# Patient Record
Sex: Male | Born: 1970 | Race: Black or African American | Hispanic: No | Marital: Married | Smoking: Never smoker
Health system: Southern US, Community
[De-identification: ages and names within clinical notes are randomized; demographics above are authoritative.]

---

## 1999-11-11 ENCOUNTER — Emergency Department (HOSPITAL_COMMUNITY): Admission: EM | Admit: 1999-11-11 | Discharge: 1999-11-11 | Payer: Self-pay | Admitting: Emergency Medicine

## 1999-11-26 ENCOUNTER — Encounter: Payer: Self-pay | Admitting: Emergency Medicine

## 1999-11-26 ENCOUNTER — Emergency Department (HOSPITAL_COMMUNITY): Admission: EM | Admit: 1999-11-26 | Discharge: 1999-11-26 | Payer: Self-pay | Admitting: Emergency Medicine

## 2005-07-14 ENCOUNTER — Emergency Department (HOSPITAL_COMMUNITY): Admission: EM | Admit: 2005-07-14 | Discharge: 2005-07-14 | Payer: Self-pay | Admitting: Emergency Medicine

## 2005-07-29 ENCOUNTER — Ambulatory Visit (HOSPITAL_COMMUNITY): Admission: RE | Admit: 2005-07-29 | Discharge: 2005-07-29 | Payer: Self-pay | Admitting: Gastroenterology

## 2006-01-10 ENCOUNTER — Emergency Department (HOSPITAL_COMMUNITY): Admission: EM | Admit: 2006-01-10 | Discharge: 2006-01-10 | Payer: Self-pay | Admitting: Family Medicine

## 2006-04-25 IMAGING — US US ABDOMEN COMPLETE
1 series · 14 of 25 positions shown · non-contrast
Comparison: none

CLINICAL DATA: Abdomen pain. 
 ABDOMEN ULTRASOUND:
TECHNIQUE: Complete abdominal ultrasound examination was performed including evaluation of the liver, gallbladder, bile ducts, pancreas, kidneys, spleen, IVC, and abdominal aorta.

[Series 1: unknown · 0.33mm/px · 14 of 59 slices shown]
[im 1/59]
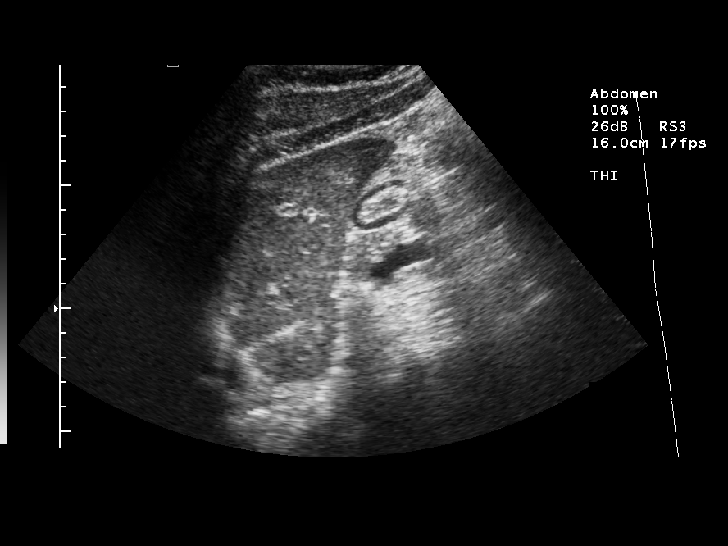
[im 5/59]
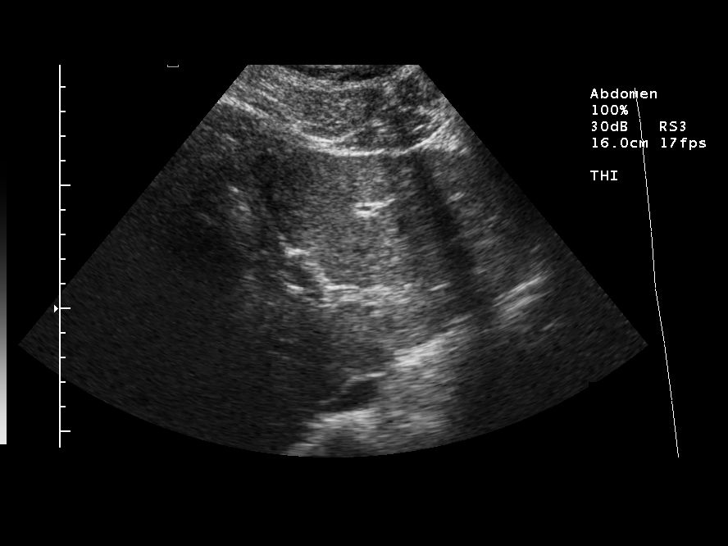
[im 10/59]
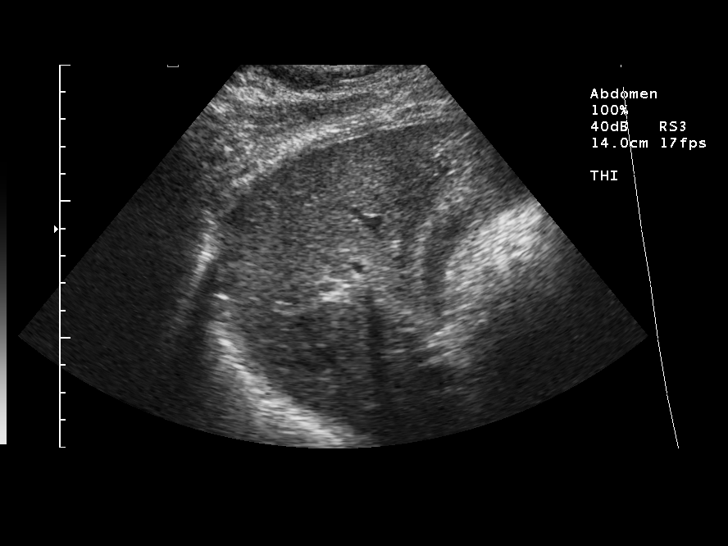
[im 15/59]
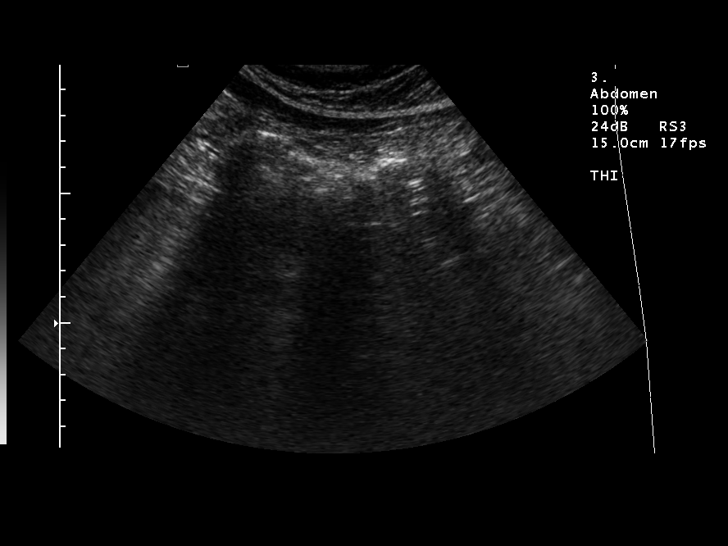
[im 20/59]
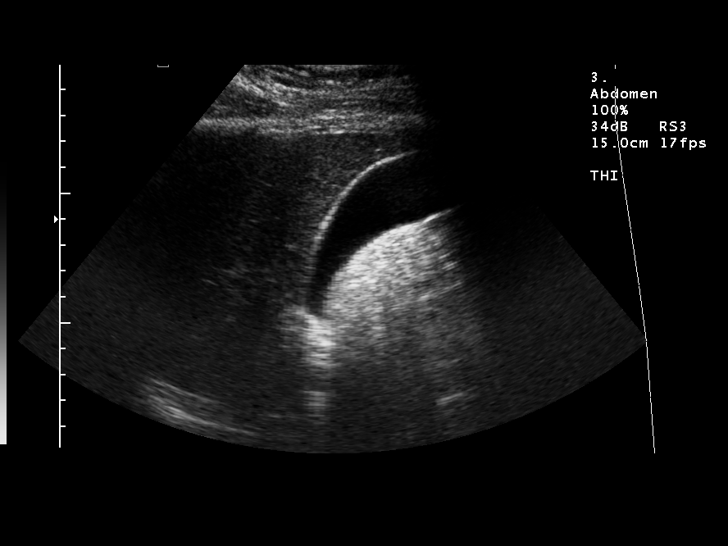
[im 22/59]
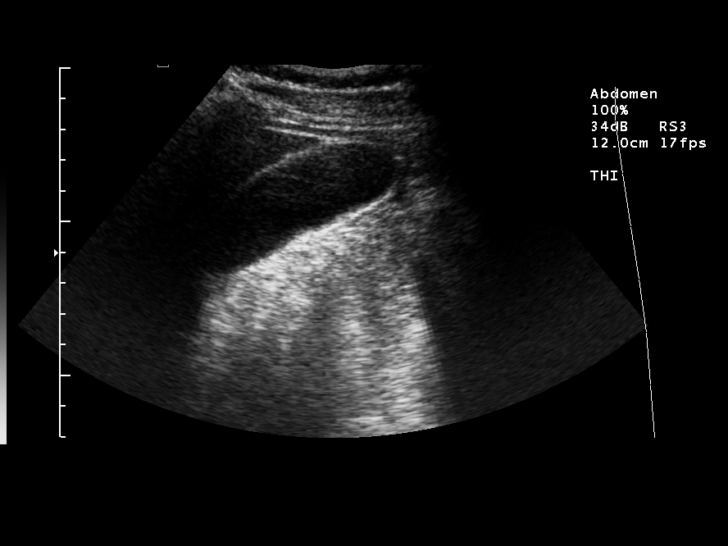
[im 27/59]
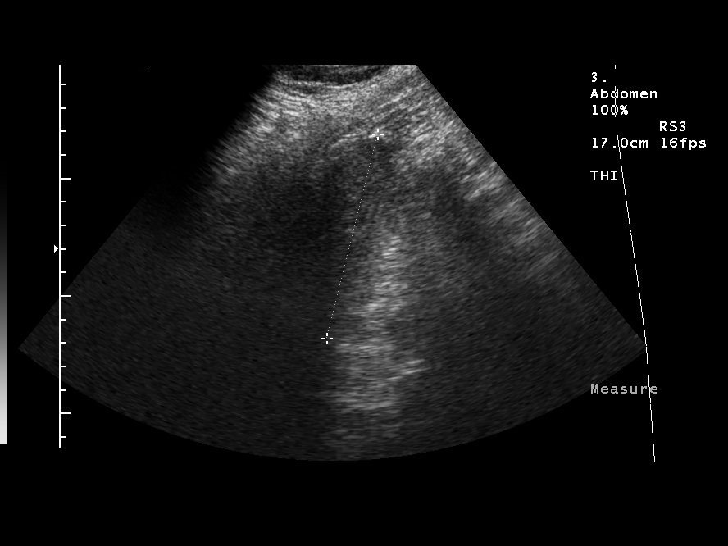
[im 32/59]
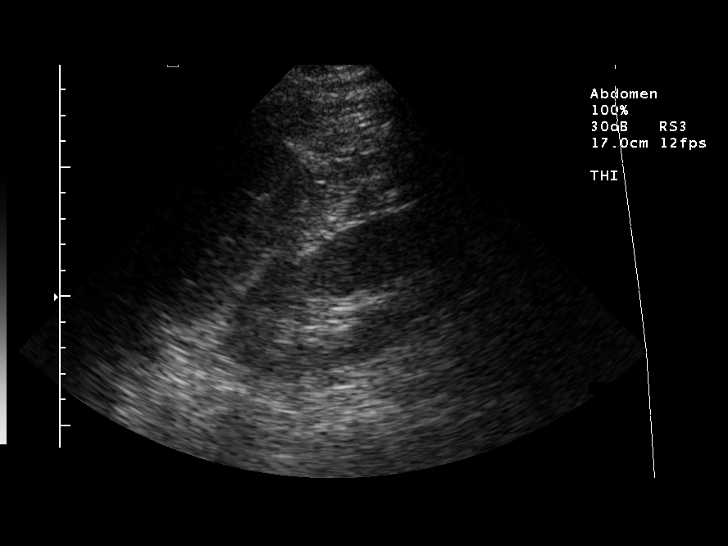
[im 37/59]
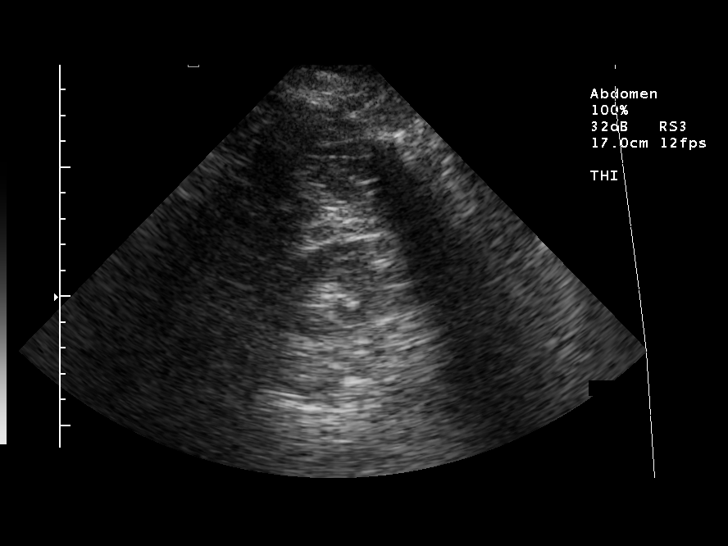
[im 39/59]
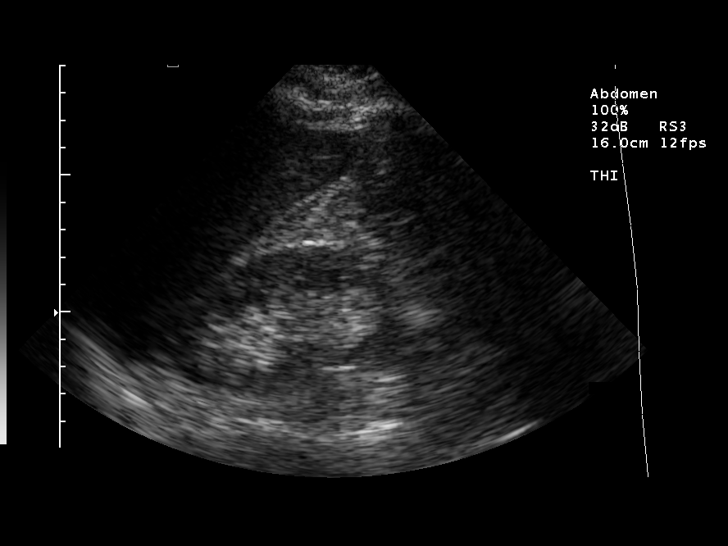
[im 44/59]
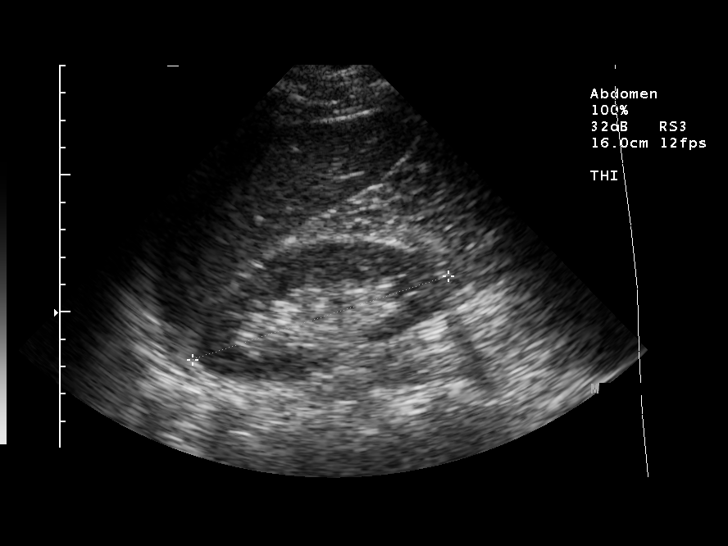
[im 49/59]
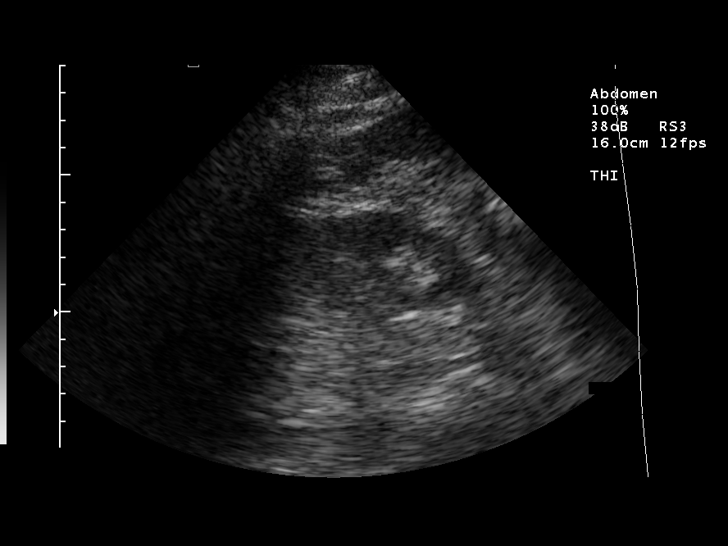
[im 54/59]
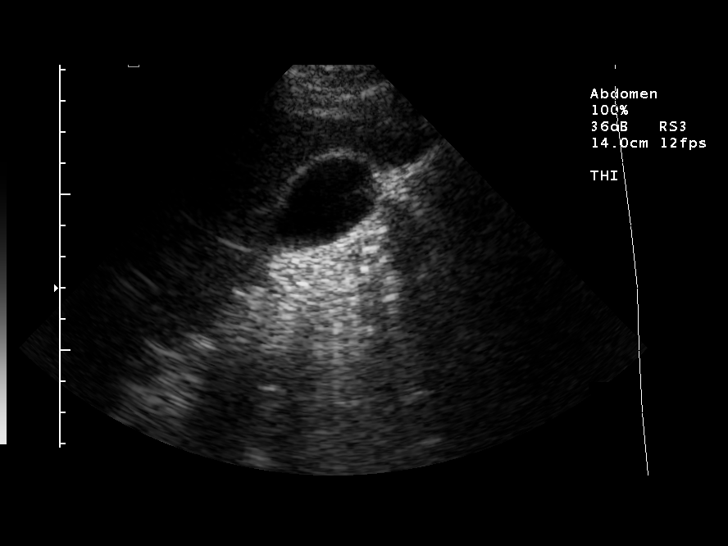
[im 59/59]
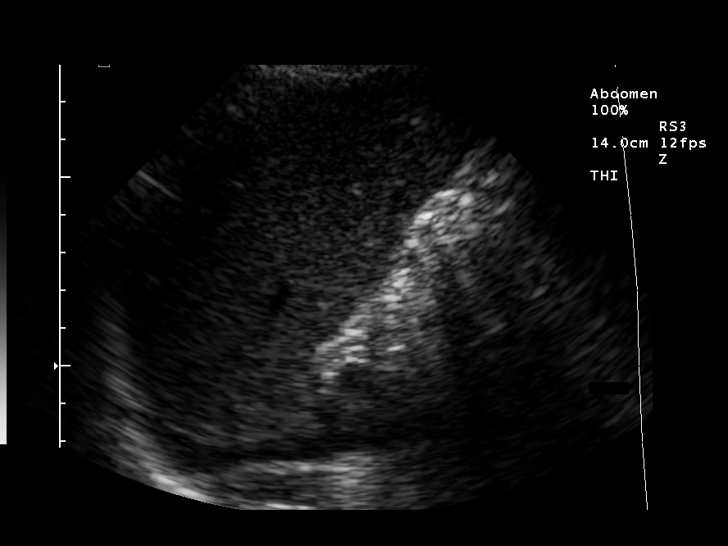

[14 of 25 positions shown; findings below may reference images not displayed]

FINDINGS: There is no evidence of gallstones or biliary ductal dilatation.  The liver is within normal limits in echogenicity, and no focal liver lesions are seen.  The visualized portions of the IVC and pancreas are unremarkable.
 There is no evidence of splenomegaly.  The kidneys are unremarkable, and there is no evidence of hydronephrosis.  The right kidney is 9.8 cm and the left kidney is 11.3 cm  Note the right kidney is smaller than the left but morphologically appears normal.  The abdominal aorta is non-dilated.
IMPRESSION: Negative abdominal ultrasound.

## 2010-12-06 ENCOUNTER — Encounter: Payer: Self-pay | Admitting: Family Medicine

## 2016-04-03 ENCOUNTER — Ambulatory Visit (HOSPITAL_COMMUNITY)
Admission: EM | Admit: 2016-04-03 | Discharge: 2016-04-03 | Disposition: A | Discharge status: Home / Self Care | Payer: BLUE CROSS/BLUE SHIELD | Attending: Emergency Medicine | Admitting: Emergency Medicine

## 2016-04-03 ENCOUNTER — Encounter (HOSPITAL_COMMUNITY): Payer: Self-pay | Admitting: Emergency Medicine

## 2016-04-03 DIAGNOSIS — J9801 Acute bronchospasm: Secondary | ICD-10-CM | POA: Diagnosis not present

## 2016-04-03 DIAGNOSIS — R0600 Dyspnea, unspecified: Secondary | ICD-10-CM | POA: Diagnosis not present

## 2016-04-03 DIAGNOSIS — J3089 Other allergic rhinitis: Secondary | ICD-10-CM

## 2016-04-03 MED ORDER — TRIAMCINOLONE ACETONIDE 40 MG/ML IJ SUSP
INTRAMUSCULAR | Status: AC
  Filled 2016-04-03: qty 1

## 2016-04-03 MED ORDER — ALBUTEROL SULFATE HFA 108 (90 BASE) MCG/ACT IN AERS: 2.0000 | INHALATION_SPRAY | RESPIRATORY_TRACT | Status: DC | PRN

## 2016-04-03 MED ORDER — IPRATROPIUM-ALBUTEROL 0.5-2.5 (3) MG/3ML IN SOLN
RESPIRATORY_TRACT | Status: AC
  Filled 2016-04-03: qty 3

## 2016-04-03 MED ORDER — IPRATROPIUM-ALBUTEROL 0.5-2.5 (3) MG/3ML IN SOLN
3.0000 mL | Freq: Once | RESPIRATORY_TRACT | Status: AC
  Administered 2016-04-03: 3 mL via RESPIRATORY_TRACT

## 2016-04-03 MED ORDER — TRIAMCINOLONE ACETONIDE 40 MG/ML IJ SUSP
40.0000 mg | Freq: Once | INTRAMUSCULAR | Status: AC
  Administered 2016-04-03: 40 mg via INTRAMUSCULAR

## 2016-04-03 MED ORDER — PREDNISONE 20 MG PO TABS: ORAL_TABLET | ORAL | Status: DC

## 2016-04-03 NOTE — Discharge Instructions (Signed)
Allergic Rhinitis For nasal and head congestion may take Sudafed PE 10 mg every 4 hours as needed. Saline nasal spray used frequently. For drainage may use Allegra, Claritin or Zyrtec. If you need stronger medicine to stop drainage may take Chlor-Trimeton 2-4 mg every 4 hours. This may cause drowsiness. Ibuprofen 600 mg every 6 hours as needed for pain, discomfort or fever. Drink plenty of fluids and stay well-hydrated. Flonase or Rhinocort nasal spray for allergies. Allergic rhinitis is when the mucous membranes in the nose respond to allergens. Allergens are particles in the air that cause your body to have an allergic reaction. This causes you to release allergic antibodies. Through a chain of events, these eventually cause you to release histamine into the blood stream. Although meant to protect the body, it is this release of histamine that causes your discomfort, such as frequent sneezing, congestion, and an itchy, runny nose.  CAUSES Seasonal allergic rhinitis (hay fever) is caused by pollen allergens that may come from grasses, trees, and weeds. Year-round allergic rhinitis (perennial allergic rhinitis) is caused by allergens such as house dust mites, pet dander, and mold spores. SYMPTOMS  Nasal stuffiness (congestion).  Itchy, runny nose with sneezing and tearing of the eyes. DIAGNOSIS Your health care provider can help you determine the allergen or allergens that trigger your symptoms. If you and your health care provider are unable to determine the allergen, skin or blood testing may be used. Your health care provider will diagnose your condition after taking your health history and performing a physical exam. Your health care provider may assess you for other related conditions, such as asthma, pink eye, or an ear infection. TREATMENT Allergic rhinitis does not have a cure, but it can be controlled by:  Medicines that block allergy symptoms. These may include allergy shots, nasal  sprays, and oral antihistamines.  Avoiding the allergen. Hay fever may often be treated with antihistamines in pill or nasal spray forms. Antihistamines block the effects of histamine. There are over-the-counter medicines that may help with nasal congestion and swelling around the eyes. Check with your health care provider before taking or giving this medicine. If avoiding the allergen or the medicine prescribed do not work, there are many new medicines your health care provider can prescribe. Stronger medicine may be used if initial measures are ineffective. Desensitizing injections can be used if medicine and avoidance does not work. Desensitization is when a patient is given ongoing shots until the body becomes less sensitive to the allergen. Make sure you follow up with your health care provider if problems continue. HOME CARE INSTRUCTIONS It is not possible to completely avoid allergens, but you can reduce your symptoms by taking steps to limit your exposure to them. It helps to know exactly what you are allergic to so that you can avoid your specific triggers. SEEK MEDICAL CARE IF:  You have a fever.  You develop a cough that does not stop easily (persistent).  You have shortness of breath.  You start wheezing.  Symptoms interfere with normal daily activities.   This information is not intended to replace advice given to you by your health care provider. Make sure you discuss any questions you have with your health care provider.   Document Released: 07/27/2001 Document Revised: 11/22/2014 Document Reviewed: 07/09/2013 Elsevier Interactive Patient Education [employer]2016 [employer]

## 2016-04-03 NOTE — ED Provider Notes (Signed)
CSN: [id number]     Arrival date & time 04/03/16  1719 History   First MD Initiated Contact with Patient 04/03/16 1756     Chief Complaint  Patient presents with  . Shortness of Breath   (Consider location/radiation/quality/duration/timing/severity/associated sxs/prior Treatment) HPI Comments: Cough esp with taking a deep breath, DOE and a sense of shortness of breath for nearly 2 months.  Patient is a 45 y.o. male presenting with shortness of breath.  Shortness of Breath Associated symptoms: cough   Associated symptoms: no chest pain, no fever and no sore throat     History reviewed. No pertinent past medical history. History reviewed. No pertinent past surgical history. No family history on file. Social History  Substance Use Topics  . Smoking status: Never Smoker   . Smokeless tobacco: None  . Alcohol Use: No    Review of Systems  Constitutional: Positive for activity change. Negative for fever and fatigue.  HENT: Negative for congestion, rhinorrhea and sore throat.   Eyes: Negative.   Respiratory: Positive for cough, chest tightness and shortness of breath.   Cardiovascular: Negative for chest pain.  Gastrointestinal: Negative.   Musculoskeletal: Negative.   Skin: Negative.     Allergies  Review of patient's allergies indicates no known allergies.  Home Medications   Prior to Admission medications   Medication Sig Start Date End Date Taking? Authorizing Provider  albuterol (PROVENTIL HFA;VENTOLIN HFA) 108 (90 Base) MCG/ACT inhaler Inhale 2 puffs into the lungs every 4 (four) hours as needed for wheezing or shortness of breath. 04/03/16   Hayden Rasmussenavid Ashtyn Freilich, NP  predniSONE (DELTASONE) 20 MG tablet Take 3 tabs po on first day, 2 tabs second day, 2 tabs third day, 1 tab fourth day, 1 tab 5th day. Take with food. 04/03/16   Hayden Rasmussenavid Keaisha Sublette, NP   Meds Ordered and Administered this Visit   Medications  ipratropium-albuterol (DUONEB) 0.5-2.5 (3) MG/3ML nebulizer solution 3 mL (3 mLs  Nebulization Given 04/03/16 1926)  triamcinolone acetonide (KENALOG-40) injection 40 mg (40 mg Intramuscular Given 04/03/16 1924)    BP 116/77 mmHg  Pulse 68  Temp(Src) 97.9 F (36.6 C) (Oral)  Resp 18  SpO2 99% No data found.   Physical Exam  Constitutional: He is oriented to person, place, and time. He appears well-developed and well-nourished. No distress.  HENT:  Mouth/Throat: Oropharynx is clear and moist. No oropharyngeal exudate.  Eyes: Conjunctivae and EOM are normal.  Neck: Normal range of motion. Neck supple.  Cardiovascular: Normal rate.   Pulmonary/Chest: Effort normal. No respiratory distress. He has wheezes. He has no rales.  Unable to take a deep breath without coughing. With partial breath and forced expiration there is wheezing. Mildly prolonged expiratory phase.  Musculoskeletal: He exhibits no edema.  Neurological: He is alert and oriented to person, place, and time. He exhibits normal muscle tone.  Skin: Skin is warm and dry.  Psychiatric: He has a normal mood and affect.  Nursing note and vitals reviewed.   ED Course  Procedures (including critical care time)  Labs Review Labs Reviewed - No data to display  Imaging Review No results found.   Visual Acuity Review  Right Eye Distance:   Left Eye Distance:   Bilateral Distance:    Right Eye Near:   Left Eye Near:    Bilateral Near:         MDM   1. Bronchospasm   2. Dyspnea   3. Other allergic rhinitis    Post DuoNeb and Kenalog  40 mg injection IM patient states he is breathing much better and having less cough. Auscultation reveals much improved air movement, no wheezing and decreased cough with deep breathing. Likely he has an having allergy symptoms which has produced a reactive airway disease with bronchospasm. Meds ordered this encounter  Medications  . ipratropium-albuterol (DUONEB) 0.5-2.5 (3) MG/3ML nebulizer solution 3 mL    Sig:   . triamcinolone acetonide (KENALOG-40)  injection 40 mg    Sig:   . albuterol (PROVENTIL HFA;VENTOLIN HFA) 108 (90 Base) MCG/ACT inhaler    Sig: Inhale 2 puffs into the lungs every 4 (four) hours as needed for wheezing or shortness of breath.    Dispense:  1 Inhaler    Refill:  0    Order Specific Question:  Supervising Provider    Answer:  [employer] [id number]  . predniSONE (DELTASONE) 20 MG tablet    Sig: Take 3 tabs po on first day, 2 tabs second day, 2 tabs third day, 1 tab fourth day, 1 tab 5th day. Take with food.    Dispense:  9 tablet    Refill:  0    Order Specific Question:  Supervising Provider    Answer:  Micheline Chapman       Hayden Rasmussen, NP 04/03/16 2002

## 2016-04-03 NOTE — ED Notes (Signed)
Pt c/o SOB on exertion onset x1 month... Voices no other concerns A&O x4... No acute distress.

## 2016-09-05 ENCOUNTER — Ambulatory Visit (HOSPITAL_COMMUNITY)
Admission: EM | Admit: 2016-09-05 | Discharge: 2016-09-05 | Disposition: A | Discharge status: Home / Self Care | Payer: BLUE CROSS/BLUE SHIELD | Attending: Family Medicine | Admitting: Family Medicine

## 2016-09-05 ENCOUNTER — Encounter (HOSPITAL_COMMUNITY): Payer: Self-pay | Admitting: *Deleted

## 2016-09-05 DIAGNOSIS — J45901 Unspecified asthma with (acute) exacerbation: Secondary | ICD-10-CM | POA: Diagnosis not present

## 2016-09-05 MED ORDER — IPRATROPIUM BROMIDE 0.02 % IN SOLN
RESPIRATORY_TRACT | Status: AC
  Filled 2016-09-05: qty 2.5

## 2016-09-05 MED ORDER — ALBUTEROL SULFATE (2.5 MG/3ML) 0.083% IN NEBU
5.0000 mg | INHALATION_SOLUTION | Freq: Once | RESPIRATORY_TRACT | Status: AC
  Administered 2016-09-05: 5 mg via RESPIRATORY_TRACT

## 2016-09-05 MED ORDER — PREDNISONE 50 MG PO TABS: ORAL_TABLET | ORAL | 0 refills | Status: AC

## 2016-09-05 MED ORDER — METHYLPREDNISOLONE ACETATE 80 MG/ML IJ SUSP
INTRAMUSCULAR | Status: AC
  Filled 2016-09-05: qty 1

## 2016-09-05 MED ORDER — ALBUTEROL SULFATE (2.5 MG/3ML) 0.083% IN NEBU
INHALATION_SOLUTION | RESPIRATORY_TRACT | Status: AC
  Filled 2016-09-05: qty 6

## 2016-09-05 MED ORDER — METHYLPREDNISOLONE ACETATE 80 MG/ML IJ SUSP
80.0000 mg | Freq: Once | INTRAMUSCULAR | Status: AC
  Administered 2016-09-05: 80 mg via INTRAMUSCULAR

## 2016-09-05 MED ORDER — IPRATROPIUM BROMIDE 0.02 % IN SOLN
0.5000 mg | Freq: Once | RESPIRATORY_TRACT | Status: AC
  Administered 2016-09-05: 0.5 mg via RESPIRATORY_TRACT

## 2016-09-05 MED ORDER — IPRATROPIUM BROMIDE 0.06 % NA SOLN: 2.0000 | Freq: Four times a day (QID) | NASAL | 1 refills | Status: AC

## 2016-09-05 NOTE — ED Notes (Signed)
pefr    500

## 2016-09-05 NOTE — ED Provider Notes (Signed)
[facility]    CSN: [id number] Arrival date & time: 09/05/16  1259     History   Chief Complaint Chief Complaint  Patient presents with  . Cough    HPI David Kramer is a 45 y.o. male.   The history is provided by the patient.  Cough  Cough characteristics:  Non-productive Sputum characteristics:  Yellow Severity:  Mild Onset quality:  Gradual Duration:  24 weeks Timing:  Sporadic Progression:  Waxing and waning Chronicity:  New Smoker: no   Context: upper respiratory infection and weather changes   Relieved by:  None tried Worsened by:  Nothing Ineffective treatments:  None tried Associated symptoms: rhinorrhea, sinus congestion and wheezing   Associated symptoms: no fever and no headaches     History reviewed. No pertinent past medical history.  There are no active problems to display for this patient.   History reviewed. No pertinent surgical history.     Home Medications    Prior to Admission medications   Medication Sig Start Date End Date Taking? Authorizing Provider  albuterol (PROVENTIL HFA;VENTOLIN HFA) 108 (90 Base) MCG/ACT inhaler Inhale 2 puffs into the lungs every 4 (four) hours as needed for wheezing or shortness of breath. 04/03/16   Hayden Rasmussenavid Mabe, NP  predniSONE (DELTASONE) 20 MG tablet Take 3 tabs po on first day, 2 tabs second day, 2 tabs third day, 1 tab fourth day, 1 tab 5th day. Take with food. 04/03/16   Hayden Rasmussenavid Mabe, NP    Family History History reviewed. No pertinent family history.  Social History Social History  Substance Use Topics  . Smoking status: Never Smoker  . Smokeless tobacco: Not on file  . Alcohol use No     Allergies   Review of patient's allergies indicates no known allergies.   Review of Systems Review of Systems  Constitutional: Negative for fever.  HENT: Positive for congestion, postnasal drip and rhinorrhea.   Respiratory: Positive for cough and wheezing.   Neurological: Negative for  headaches.  All other systems reviewed and are negative.    Physical Exam Triage Vital Signs ED Triage Vitals  Enc Vitals Group     BP 09/05/16 1431 132/78     Pulse Rate 09/05/16 1431 88     Resp 09/05/16 1431 18     Temp 09/05/16 1431 98.6 F (37 C)     Temp src --      SpO2 09/05/16 1431 98 %     Weight --      Height --      Head Circumference --      Peak Flow --      Pain Score 09/05/16 1430 7     Pain Loc --      Pain Edu? --      Excl. in GC? --    No data found.   Updated Vital Signs BP 132/78   Pulse 88   Temp 98.6 F (37 C)   Resp 18   SpO2 98%   Visual Acuity Right Eye Distance:   Left Eye Distance:   Bilateral Distance:    Right Eye Near:   Left Eye Near:    Bilateral Near:     Physical Exam  Constitutional: He is oriented to person, place, and time. He appears well-developed and well-nourished.  HENT:  Right Ear: External ear normal.  Left Ear: External ear normal.  Nose: Nose normal.  Mouth/Throat: Oropharynx is clear and moist.  Eyes: Pupils are equal,  round, and reactive to light.  Neck: Normal range of motion.  Cardiovascular: Normal rate, regular rhythm, normal heart sounds and intact distal pulses.   Pulmonary/Chest: Effort normal and breath sounds normal. He has no wheezes. He has no rales. He exhibits no tenderness.  Abdominal: Soft. Bowel sounds are normal.  Lymphadenopathy:    He has no cervical adenopathy.  Neurological: He is alert and oriented to person, place, and time.  Skin: Skin is warm and dry.  Nursing note and vitals reviewed.    UC Treatments / Results  Labs (all labs ordered are listed, but only abnormal results are displayed) Labs Reviewed - No data to display  EKG  EKG Interpretation None       Radiology No results found.  Procedures Procedures (including critical care time)  Medications Ordered in UC Medications  methylPREDNISolone acetate (DEPO-MEDROL) injection 80 mg (not administered)    albuterol (PROVENTIL) (2.5 MG/3ML) 0.083% nebulizer solution 5 mg (not administered)  ipratropium (ATROVENT) nebulizer solution 0.5 mg (not administered)     Initial Impression / Assessment and Plan / UC Course  I have reviewed the triage vital signs and the nursing notes.  Pertinent labs & imaging results that were available during my care of the patient were reviewed by me and considered in my medical decision making (see chart for details).  Clinical Course    Peak flow post was 500, sx improved., lungs clear.  Final Clinical Impressions(s) / UC Diagnoses   Final diagnoses:  None    New Prescriptions New Prescriptions   No medications on file     Linna Hoff, MD 09/05/16 1555

## 2016-09-05 NOTE — ED Triage Notes (Signed)
Pt  Reports    Symptoms  Of  Cough   /   Congestion       Sinus   Congestion       And  Drainage      With  Symptoms  X  4-  5     Days    Pt    Reports    Has had  Bronchospasms  In the  Past     Pt  Has  Seasonal  Allergies
